# Patient Record
Sex: Female | Born: 1975 | Race: Black or African American | Hispanic: No | Marital: Married
Health system: Southern US, Community
[De-identification: ages and names within clinical notes are randomized; demographics above are authoritative.]

---

## 2018-04-06 ENCOUNTER — Other Ambulatory Visit: Payer: Self-pay | Admitting: Internal Medicine

## 2018-04-06 DIAGNOSIS — Z1231 Encounter for screening mammogram for malignant neoplasm of breast: Secondary | ICD-10-CM

## 2018-07-03 ENCOUNTER — Ambulatory Visit
Admission: RE | Admit: 2018-07-03 | Discharge: 2018-07-03 | Disposition: A | Discharge status: Home / Self Care | Payer: BLUE CROSS/BLUE SHIELD | Source: Ambulatory Visit | Attending: Internal Medicine | Admitting: Internal Medicine

## 2018-07-03 DIAGNOSIS — Z1231 Encounter for screening mammogram for malignant neoplasm of breast: Secondary | ICD-10-CM

## 2018-07-18 ENCOUNTER — Other Ambulatory Visit (HOSPITAL_COMMUNITY)
Admission: RE | Admit: 2018-07-18 | Discharge: 2018-07-18 | Disposition: A | Discharge status: Home / Self Care | Payer: BLUE CROSS/BLUE SHIELD | Source: Ambulatory Visit | Attending: Obstetrics and Gynecology | Admitting: Obstetrics and Gynecology

## 2018-07-18 ENCOUNTER — Other Ambulatory Visit: Payer: Self-pay | Admitting: Obstetrics and Gynecology

## 2018-07-18 DIAGNOSIS — Z01411 Encounter for gynecological examination (general) (routine) with abnormal findings: Secondary | ICD-10-CM | POA: Diagnosis present

## 2018-07-19 LAB — CYTOLOGY - PAP
Diagnosis: NEGATIVE
HPV (WINDOPATH): NOT DETECTED

## 2019-07-09 ENCOUNTER — Other Ambulatory Visit: Payer: Self-pay | Admitting: Obstetrics and Gynecology

## 2019-07-09 DIAGNOSIS — Z1231 Encounter for screening mammogram for malignant neoplasm of breast: Secondary | ICD-10-CM

## 2019-07-18 ENCOUNTER — Ambulatory Visit
Admission: RE | Admit: 2019-07-18 | Discharge: 2019-07-18 | Disposition: A | Discharge status: Home / Self Care | Payer: BLUE CROSS/BLUE SHIELD | Source: Ambulatory Visit | Attending: Obstetrics and Gynecology | Admitting: Obstetrics and Gynecology

## 2019-07-18 ENCOUNTER — Other Ambulatory Visit: Payer: Self-pay

## 2019-07-18 DIAGNOSIS — Z1231 Encounter for screening mammogram for malignant neoplasm of breast: Secondary | ICD-10-CM

## 2020-07-08 ENCOUNTER — Other Ambulatory Visit: Payer: Self-pay | Admitting: Obstetrics and Gynecology

## 2020-07-08 DIAGNOSIS — Z1231 Encounter for screening mammogram for malignant neoplasm of breast: Secondary | ICD-10-CM

## 2020-08-06 ENCOUNTER — Other Ambulatory Visit: Payer: Self-pay

## 2020-08-06 ENCOUNTER — Ambulatory Visit
Admission: RE | Admit: 2020-08-06 | Discharge: 2020-08-06 | Disposition: A | Discharge status: Home / Self Care | Payer: BC Managed Care – PPO | Source: Ambulatory Visit | Attending: Obstetrics and Gynecology | Admitting: Obstetrics and Gynecology

## 2020-08-06 DIAGNOSIS — Z1231 Encounter for screening mammogram for malignant neoplasm of breast: Secondary | ICD-10-CM

## 2021-01-22 ENCOUNTER — Ambulatory Visit (INDEPENDENT_AMBULATORY_CARE_PROVIDER_SITE_OTHER): Payer: BC Managed Care – PPO

## 2021-01-22 ENCOUNTER — Other Ambulatory Visit: Payer: Self-pay

## 2021-01-22 ENCOUNTER — Ambulatory Visit: Payer: BC Managed Care – PPO | Admitting: Podiatry

## 2021-01-22 DIAGNOSIS — M722 Plantar fascial fibromatosis: Secondary | ICD-10-CM

## 2021-01-22 DIAGNOSIS — M79671 Pain in right foot: Secondary | ICD-10-CM | POA: Diagnosis not present

## 2021-01-22 MED ORDER — TRIAMCINOLONE ACETONIDE 10 MG/ML IJ SUSP
10.0000 mg | Freq: Once | INTRAMUSCULAR | Status: AC
Start: 2021-01-22 — End: 2021-01-22
  Administered 2021-01-22: 10 mg

## 2021-01-22 MED ORDER — MELOXICAM 15 MG PO TABS: 15.0000 mg | ORAL_TABLET | Freq: Every day | ORAL | 0 refills | Status: AC

## 2021-01-22 NOTE — Progress Notes (Signed)
Subjective:   Patient ID: Leeroy Bock, female   DOB: 45 y.o.   MRN: [id number]   HPI 45 year old female presents the office today for concerns of right heel pain which is been ongoing for about 1 month.  She states it is tender to touch.  She feels that she is on her feet all day which aggravates her symptoms.  She works for [employer] and she works on Emergency planning/management officer toed shoes which aggravates her symptoms.  She tried over-the-counter Tylenol without any relief.  No recent injury or falls.  No numbness or tingling.  No other concerns.   Review of Systems  All other systems reviewed and are negative.  No past medical history on file.  No past surgical history on file.   Current Outpatient Medications:  .  meloxicam (MOBIC) 15 MG tablet, Take 1 tablet (15 mg total) by mouth daily., Disp: 30 tablet, Rfl: 0  Allergies  Allergen Reactions  . Cephalexin Swelling    Swelling of the tongue Tongue swelling   . Penicillins Swelling    Swelling of the tongue Tongue swelling         Objective:  Physical Exam  General: AAO x3, NAD  Dermatological: Skin is warm, dry and supple bilateral.  There are no open sores, no preulcerative lesions, no rash or signs of infection present.  Vascular: Dorsalis Pedis artery and Posterior Tibial artery pedal pulses are 2/4 bilateral with immedate capillary fill time. There is no pain with calf compression, swelling, warmth, erythema.   Neruologic: Grossly intact via light touch bilateral.  Negative Tinel sign.  Musculoskeletal: Tenderness to palpation along the plantar medial tubercle of the calcaneus at the insertion of plantar fascia on the right foot. There is no pain along the course of the plantar fascia within the arch of the foot. Plantar fascia appears to be intact. There is no pain with lateral compression of the calcaneus or pain with vibratory sensation. There is no pain along the course or insertion of the achilles tendon.  No other areas of tenderness to bilateral lower extremities. Muscular strength 5/5 in all groups tested bilateral.  Gait: Unassisted, Nonantalgic.       Assessment:   Right heel pain, plan fasciitis     Plan:  -Treatment options discussed including all alternatives, risks, and complications -Etiology of symptoms were discussed -X-rays were obtained and reviewed with the patient.  Inferior calcaneal spurring present.  No evidence of acute fracture. -Steroid injection performed.  See procedure note below -Plantar fascial brace dispensed -Prescribed mobic. Discussed side effects of the medication and directed to stop if any are to occur and call the office.  -Stretching, icing daily.  We discussed shoe modifications and orthotics.  She can start with an over-the-counter insert if needed we can make a custom insert.  Procedure: Injection Tendon/Ligament Discussed alternatives, risks, complications and verbal consent was obtained.  Location: Right plantar fascia at the glabrous junction; medial approach. Skin Prep: Alcohol. Injectate: 0.5cc 0.5% marcaine plain, 0.5 cc 2% lidocaine plain and, 1 cc kenalog 10. Disposition: Patient tolerated procedure well. Injection site dressed with a band-aid.  Post-injection care was discussed and return precautions discussed.   Return for platnar fasciits, heel pain in 4-6 weeks.  Vivi Barrack DPM

## 2021-01-22 NOTE — Patient Instructions (Signed)
For instructions on how to put on your Plantar Fascial Brace, please visit [email or web address]   Plantar Fasciitis (Heel Spur Syndrome) with Rehab The plantar fascia is a fibrous, ligament-like, soft-tissue structure that spans the bottom of the foot. Plantar fasciitis is a condition that causes pain in the foot due to inflammation of the tissue. SYMPTOMS   Pain and tenderness on the underneath side of the foot.  Pain that worsens with standing or walking. CAUSES  Plantar fasciitis is caused by irritation and injury to the plantar fascia on the underneath side of the foot. Common mechanisms of injury include:  Direct trauma to bottom of the foot.  Damage to a small nerve that runs under the foot where the main fascia attaches to the heel bone.  Stress placed on the plantar fascia due to bone spurs. RISK INCREASES WITH:   Activities that place stress on the plantar fascia (running, jumping, pivoting, or cutting).  Poor strength and flexibility.  Improperly fitted shoes.  Tight calf muscles.  Flat feet.  Failure to warm-up properly before activity.  Obesity. PREVENTION  Warm up and stretch properly before activity.  Allow for adequate recovery between workouts.  Maintain physical fitness:  Strength, flexibility, and endurance.  Cardiovascular fitness.  Maintain a health body weight.  Avoid stress on the plantar fascia.  Wear properly fitted shoes, including arch supports for individuals who have flat feet.  PROGNOSIS  If treated properly, then the symptoms of plantar fasciitis usually resolve without surgery. However, occasionally surgery is necessary.  RELATED COMPLICATIONS   Recurrent symptoms that may result in a chronic condition.  Problems of the lower back that are caused by compensating for the injury, such as limping.  Pain or weakness of the foot during push-off following surgery.  Chronic inflammation, scarring, and partial or complete  fascia tear, occurring more often from repeated injections.  TREATMENT  Treatment initially involves the use of ice and medication to help reduce pain and inflammation. The use of strengthening and stretching exercises may help reduce pain with activity, especially stretches of the Achilles tendon. These exercises may be performed at home or with a therapist. Your caregiver may recommend that you use heel cups of arch supports to help reduce stress on the plantar fascia. Occasionally, corticosteroid injections are given to reduce inflammation. If symptoms persist for greater than 6 months despite non-surgical (conservative), then surgery may be recommended.   MEDICATION   If pain medication is necessary, then nonsteroidal anti-inflammatory medications, such as aspirin and ibuprofen, or other minor pain relievers, such as acetaminophen, are often recommended.  Do not take pain medication within 7 days before surgery.  Prescription pain relievers may be given if deemed necessary by your caregiver. Use only as directed and only as much as you need.  Corticosteroid injections may be given by your caregiver. These injections should be reserved for the most serious cases, because they may only be given a certain number of times.  HEAT AND COLD  Cold treatment (icing) relieves pain and reduces inflammation. Cold treatment should be applied for 10 to 15 minutes every 2 to 3 hours for inflammation and pain and immediately after any activity that aggravates your symptoms. Use ice packs or massage the area with a piece of ice (ice massage).  Heat treatment may be used prior to performing the stretching and strengthening activities prescribed by your caregiver, physical therapist, or athletic trainer. Use a heat pack or soak the injury in warm water.  SEEK IMMEDIATE MEDICAL   CARE IF:  Treatment seems to offer no benefit, or the condition worsens.  Any medications produce adverse side effects.   EXERCISES- RANGE OF MOTION (ROM) AND STRETCHING EXERCISES - Plantar Fasciitis (Heel Spur Syndrome) These exercises may help you when beginning to rehabilitate your injury. Your symptoms may resolve with or without further involvement from your physician, physical therapist or athletic trainer. While completing these exercises, remember:   Restoring tissue flexibility helps normal motion to return to the joints. This allows healthier, less painful movement and activity.  An effective stretch should be held for at least 30 seconds.  A stretch should never be painful. You should only feel a gentle lengthening or release in the stretched tissue.  RANGE OF MOTION - Toe Extension, Flexion  Sit with your right / left leg crossed over your opposite knee.  Grasp your toes and gently pull them back toward the top of your foot. You should feel a stretch on the bottom of your toes and/or foot.  Hold this stretch for 10 seconds.  Now, gently pull your toes toward the bottom of your foot. You should feel a stretch on the top of your toes and or foot.  Hold this stretch for 10 seconds. Repeat  times. Complete this stretch 3 times per day.   RANGE OF MOTION - Ankle Dorsiflexion, Active Assisted  Remove shoes and sit on a chair that is preferably not on a carpeted surface.  Place right / left foot under knee. Extend your opposite leg for support.  Keeping your heel down, slide your right / left foot back toward the chair until you feel a stretch at your ankle or calf. If you do not feel a stretch, slide your bottom forward to the edge of the chair, while still keeping your heel down.  Hold this stretch for 10 seconds. Repeat 3 times. Complete this stretch 2 times per day.   STRETCH  Gastroc, Standing  Place hands on wall.  Extend right / left leg, keeping the front knee somewhat bent.  Slightly point your toes inward on your back foot.  Keeping your right / left heel on the floor and your  knee straight, shift your weight toward the wall, not allowing your back to arch.  You should feel a gentle stretch in the right / left calf. Hold this position for 10 seconds. Repeat 3 times. Complete this stretch 2 times per day.  STRETCH  Soleus, Standing  Place hands on wall.  Extend right / left leg, keeping the other knee somewhat bent.  Slightly point your toes inward on your back foot.  Keep your right / left heel on the floor, bend your back knee, and slightly shift your weight over the back leg so that you feel a gentle stretch deep in your back calf.  Hold this position for 10 seconds. Repeat 3 times. Complete this stretch 2 times per day.  STRETCH  Gastrocsoleus, Standing  Note: This exercise can place a lot of stress on your foot and ankle. Please complete this exercise only if specifically instructed by your caregiver.   Place the ball of your right / left foot on a step, keeping your other foot firmly on the same step.  Hold on to the wall or a rail for balance.  Slowly lift your other foot, allowing your body weight to press your heel down over the edge of the step.  You should feel a stretch in your right / left calf.  Hold this   position for 10 seconds.  Repeat this exercise with a slight bend in your right / left knee. Repeat 3 times. Complete this stretch 2 times per day.   STRENGTHENING EXERCISES - Plantar Fasciitis (Heel Spur Syndrome)  These exercises may help you when beginning to rehabilitate your injury. They may resolve your symptoms with or without further involvement from your physician, physical therapist or athletic trainer. While completing these exercises, remember:   Muscles can gain both the endurance and the strength needed for everyday activities through controlled exercises.  Complete these exercises as instructed by your physician, physical therapist or athletic trainer. Progress the resistance and repetitions only as guided.  STRENGTH -  Towel Curls  Sit in a chair positioned on a non-carpeted surface.  Place your foot on a towel, keeping your heel on the floor.  Pull the towel toward your heel by only curling your toes. Keep your heel on the floor. Repeat 3 times. Complete this exercise 2 times per day.  STRENGTH - Ankle Inversion  Secure one end of a rubber exercise band/tubing to a fixed object (table, pole). Loop the other end around your foot just before your toes.  Place your fists between your knees. This will focus your strengthening at your ankle.  Slowly, pull your big toe up and in, making sure the band/tubing is positioned to resist the entire motion.  Hold this position for 10 seconds.  Have your muscles resist the band/tubing as it slowly pulls your foot back to the starting position. Repeat 3 times. Complete this exercises 2 times per day.  Document Released: 09/13/2005 Document Revised: 12/06/2011 Document Reviewed: 12/26/2008 [employer] Patient Information 2014 [employer]. 

## 2021-02-19 ENCOUNTER — Ambulatory Visit: Payer: BC Managed Care – PPO | Admitting: Podiatry

## 2021-03-09 ENCOUNTER — Ambulatory Visit: Payer: BC Managed Care – PPO | Admitting: Podiatry

## 2021-07-10 ENCOUNTER — Other Ambulatory Visit: Payer: Self-pay | Admitting: Obstetrics and Gynecology

## 2021-07-10 DIAGNOSIS — Z1231 Encounter for screening mammogram for malignant neoplasm of breast: Secondary | ICD-10-CM

## 2021-08-07 ENCOUNTER — Ambulatory Visit
Admission: RE | Admit: 2021-08-07 | Discharge: 2021-08-07 | Disposition: A | Discharge status: Home / Self Care | Payer: BC Managed Care – PPO | Source: Ambulatory Visit | Attending: Obstetrics and Gynecology | Admitting: Obstetrics and Gynecology

## 2021-08-07 ENCOUNTER — Other Ambulatory Visit: Payer: Self-pay

## 2021-08-07 DIAGNOSIS — Z1231 Encounter for screening mammogram for malignant neoplasm of breast: Secondary | ICD-10-CM

## 2021-09-14 ENCOUNTER — Other Ambulatory Visit: Payer: Self-pay | Admitting: Internal Medicine

## 2021-09-14 DIAGNOSIS — R110 Nausea: Secondary | ICD-10-CM

## 2021-09-22 ENCOUNTER — Ambulatory Visit
Admission: RE | Admit: 2021-09-22 | Discharge: 2021-09-22 | Disposition: A | Discharge status: Home / Self Care | Payer: BC Managed Care – PPO | Source: Ambulatory Visit | Attending: Internal Medicine | Admitting: Internal Medicine

## 2021-09-22 DIAGNOSIS — R110 Nausea: Secondary | ICD-10-CM

## 2022-07-28 ENCOUNTER — Other Ambulatory Visit: Payer: Self-pay | Admitting: Obstetrics and Gynecology

## 2022-07-28 DIAGNOSIS — Z1231 Encounter for screening mammogram for malignant neoplasm of breast: Secondary | ICD-10-CM

## 2022-08-10 ENCOUNTER — Ambulatory Visit
Admission: RE | Admit: 2022-08-10 | Discharge: 2022-08-10 | Disposition: A | Discharge status: Home / Self Care | Payer: BC Managed Care – PPO | Source: Ambulatory Visit | Attending: Obstetrics and Gynecology | Admitting: Obstetrics and Gynecology

## 2022-08-10 DIAGNOSIS — Z1231 Encounter for screening mammogram for malignant neoplasm of breast: Secondary | ICD-10-CM

## 2023-06-11 IMAGING — MG MM DIGITAL SCREENING BILAT W/ TOMO AND CAD
8 series · 8 of 24 positions shown · non-contrast
Comparison: Previous exam(s).

CLINICAL DATA: Screening.

EXAM:
DIGITAL SCREENING BILATERAL MAMMOGRAM WITH TOMOSYNTHESIS AND CAD
TECHNIQUE: Bilateral screening digital craniocaudal and mediolateral oblique
mammograms were obtained. Bilateral screening digital breast
tomosynthesis was performed. The images were evaluated with
computer-aided detection.

[L CC synth-2D]
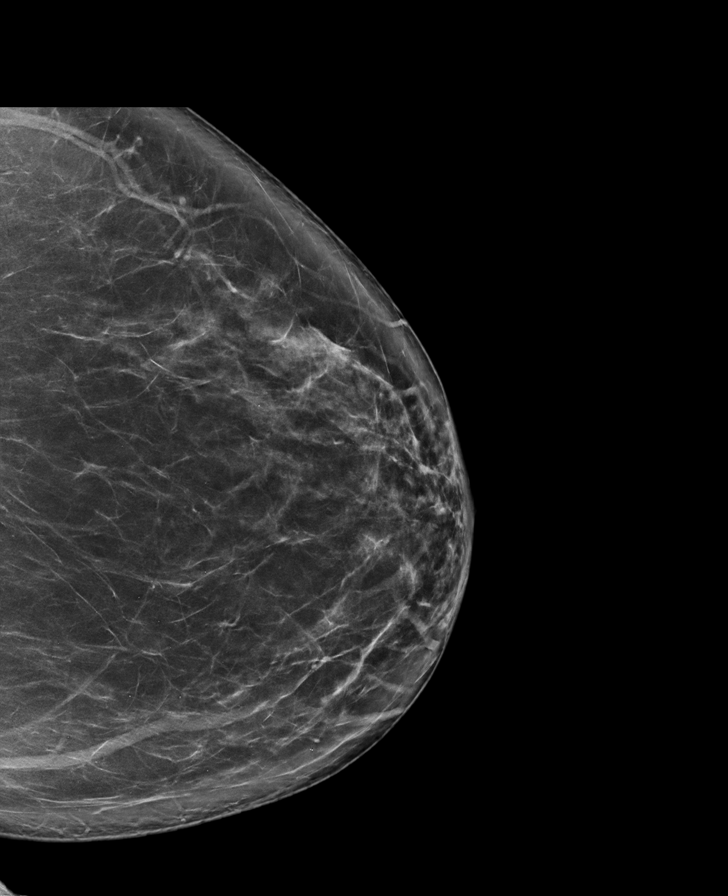

[R MLO synth-2D]
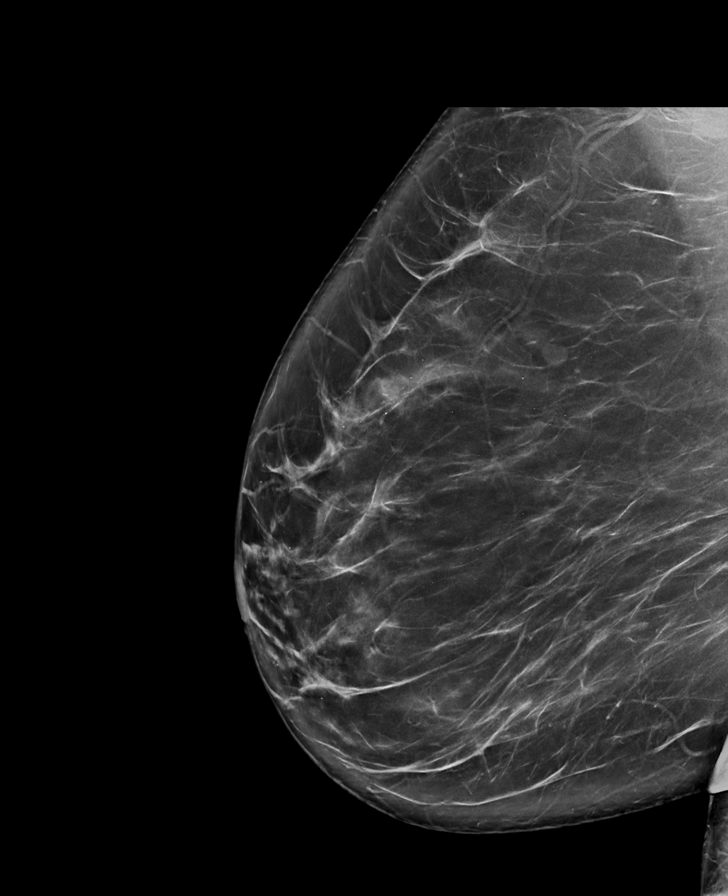

[R CC synth-2D]
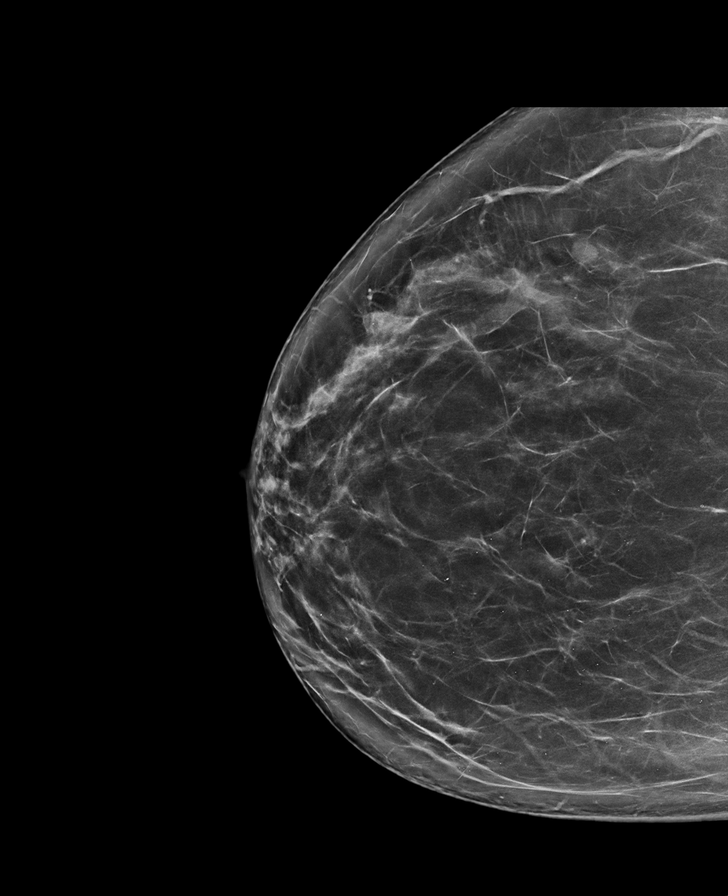

[L MLO synth-2D]
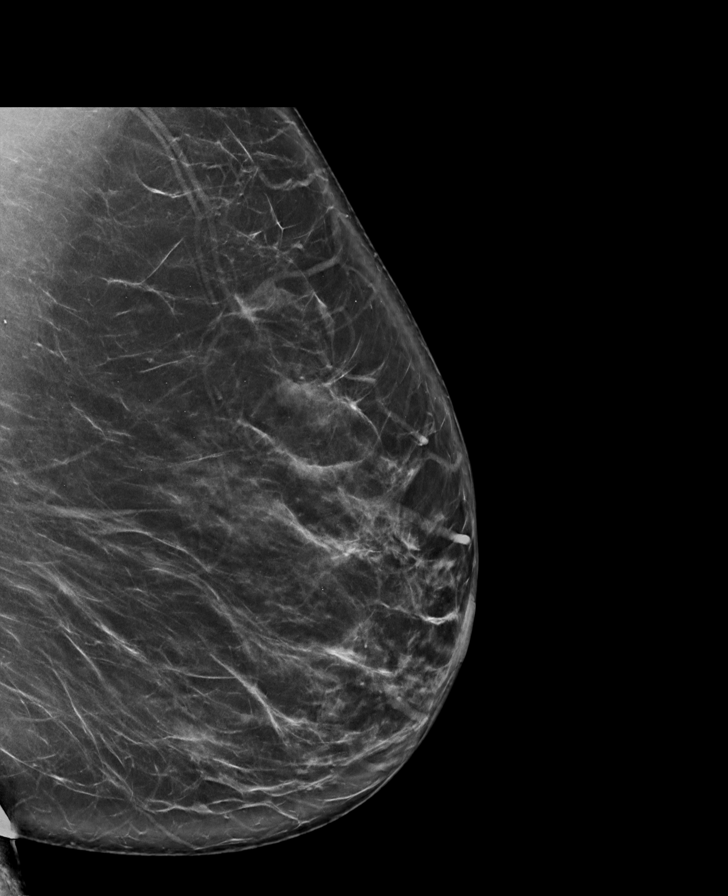

[R CC tomo · tomo slice 47/92.0]
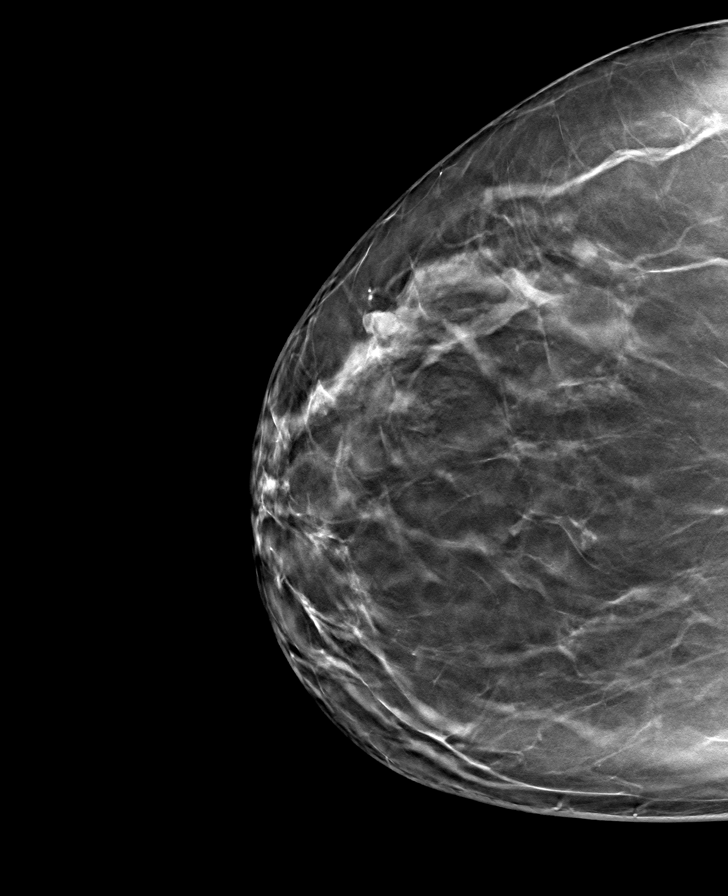

[R MLO tomo · tomo slice 51/101.0]
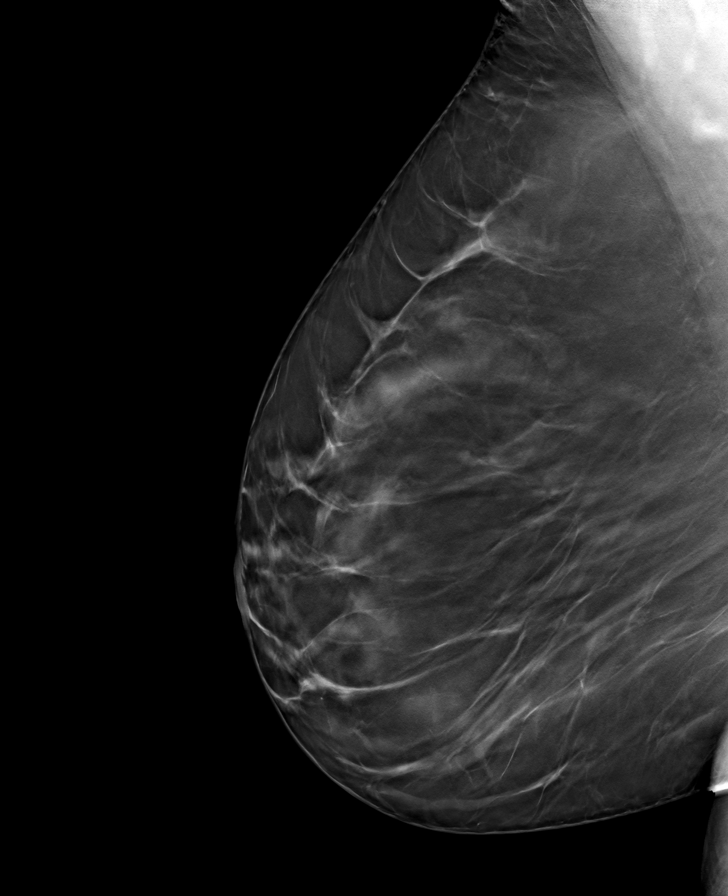

[L MLO tomo · tomo slice 49/98.0]
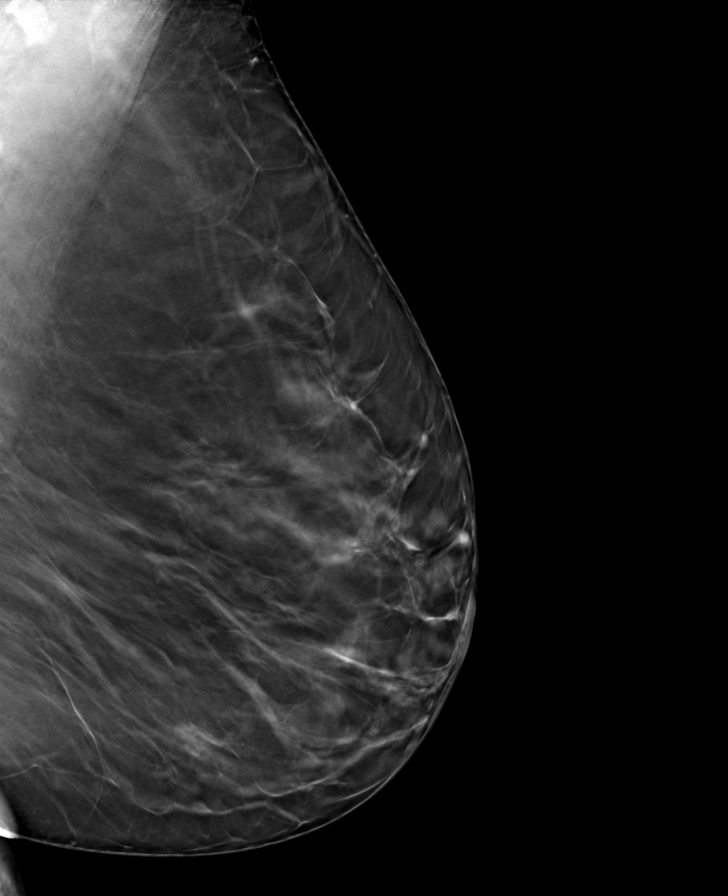

[L CC tomo · tomo slice 49/96.0]
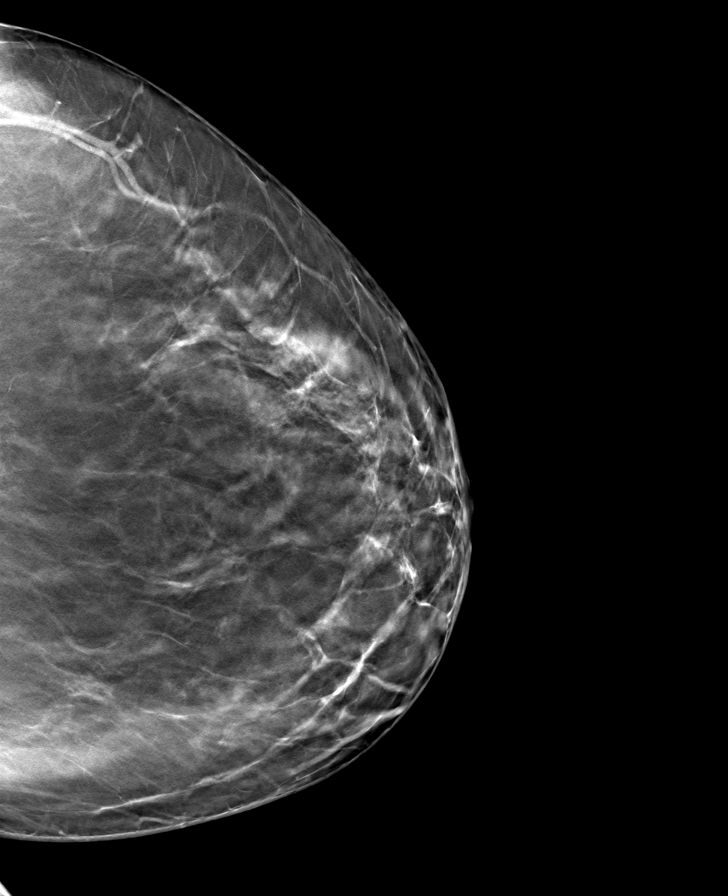

[8 of 24 positions shown; findings below may reference images not displayed]

ACR Breast Density Category b: There are scattered areas of
fibroglandular density.
FINDINGS: There are no findings suspicious for malignancy.
IMPRESSION: No mammographic evidence of malignancy. A result letter of this
screening mammogram will be mailed directly to the patient.

RECOMMENDATION:
Screening mammogram in one year. (Code:51-O-LD2)

BI-RADS CATEGORY  1: Negative.

## 2023-07-07 ENCOUNTER — Other Ambulatory Visit: Payer: Self-pay | Admitting: Obstetrics and Gynecology

## 2023-07-07 DIAGNOSIS — Z1231 Encounter for screening mammogram for malignant neoplasm of breast: Secondary | ICD-10-CM

## 2023-07-27 IMAGING — US US ABDOMEN COMPLETE
1 series · 14 of 25 positions shown · non-contrast
Comparison: None.

CLINICAL DATA: Nausea

EXAM:
ABDOMEN ULTRASOUND COMPLETE

[Series 1: us abdomen complete · 0.23mm/px · 14 of 68 slices shown]
[im 1/68]
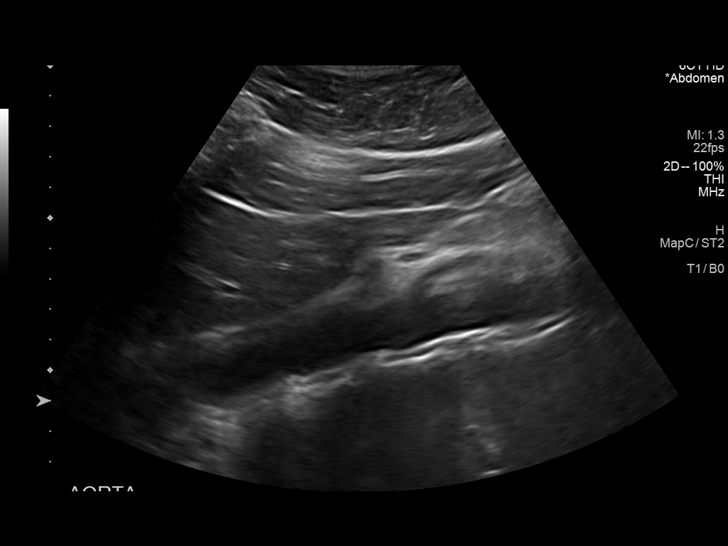
[im 6/68]
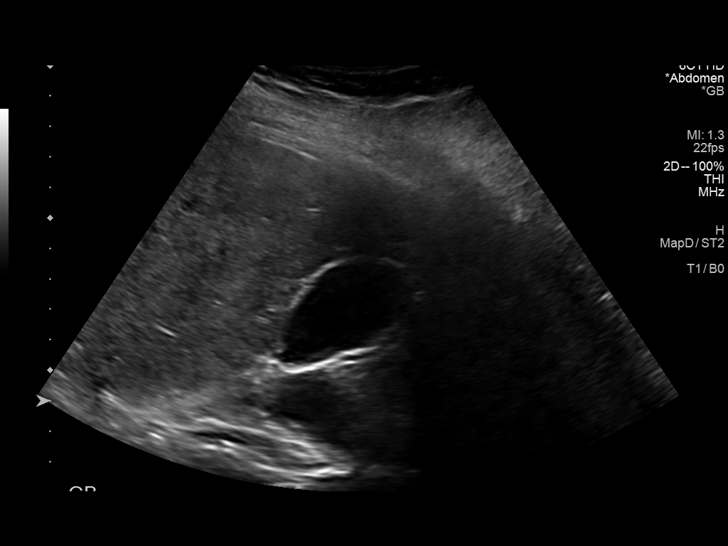
[im 12/68]
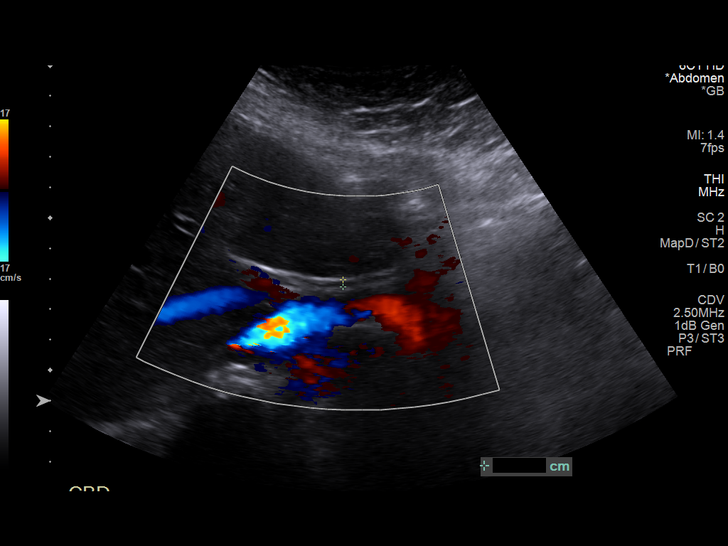
[im 17/68]
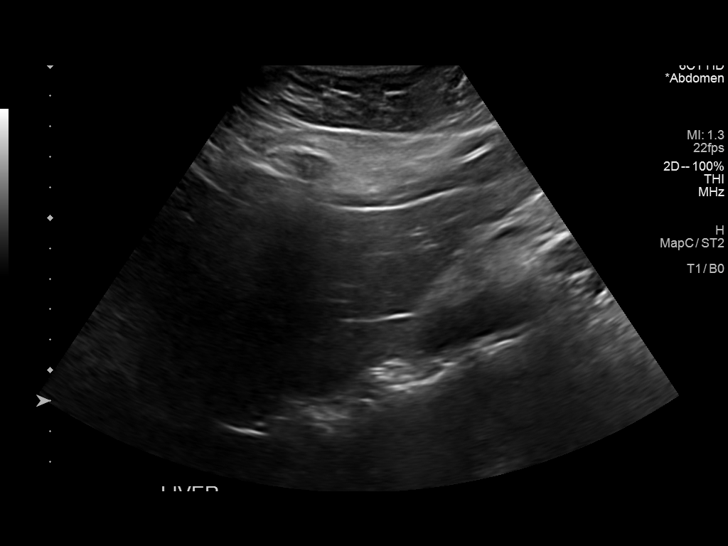
[im 23/68]
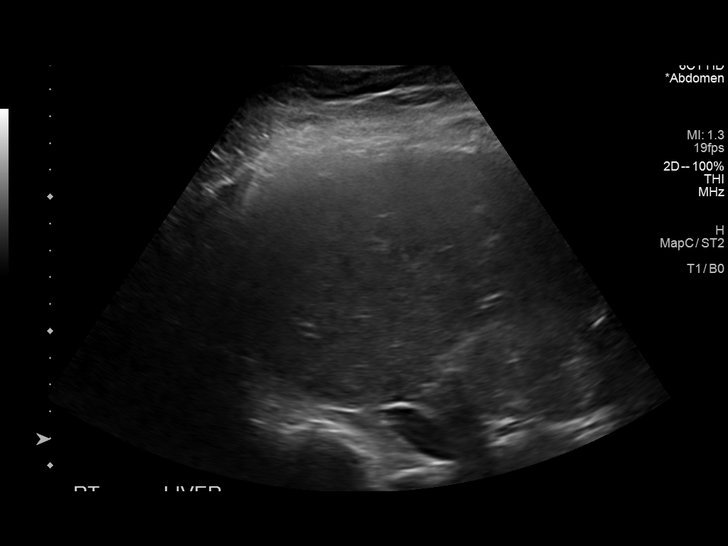
[im 26/68]
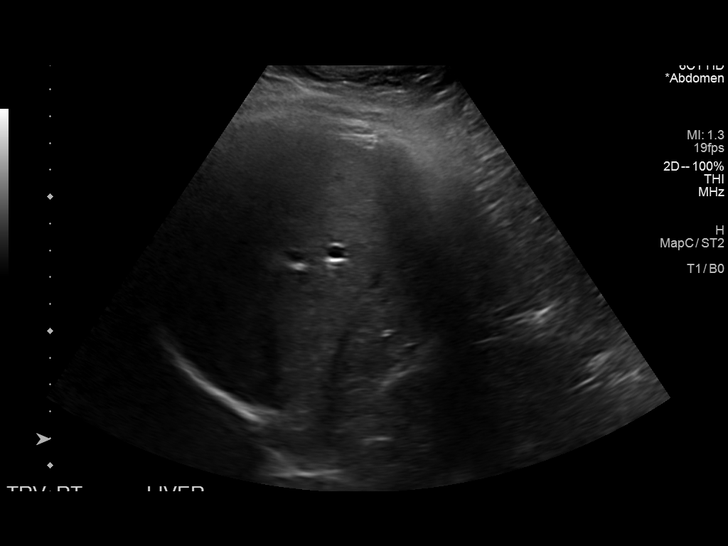
[im 31/68]
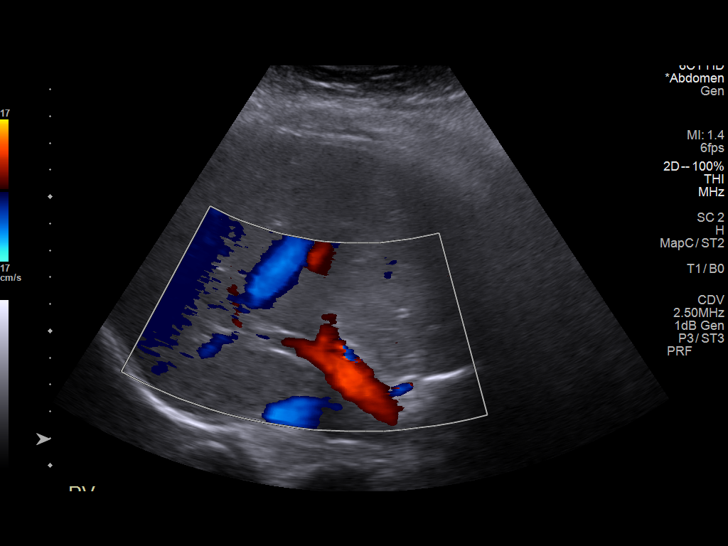
[im 37/68]
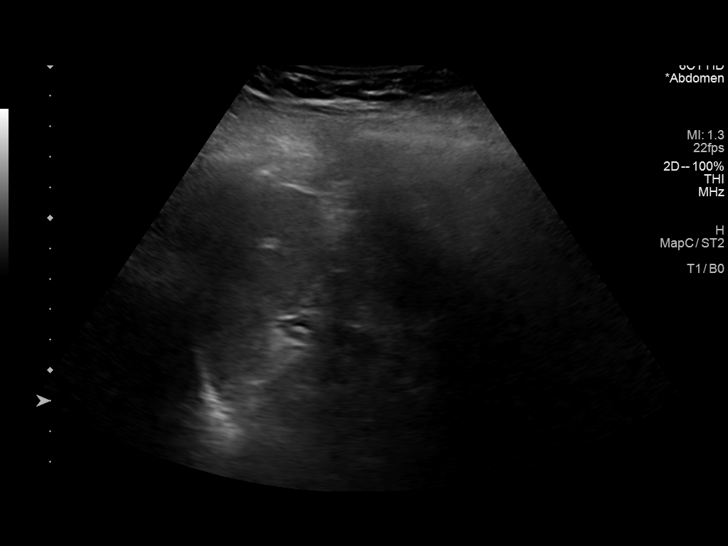
[im 42/68]
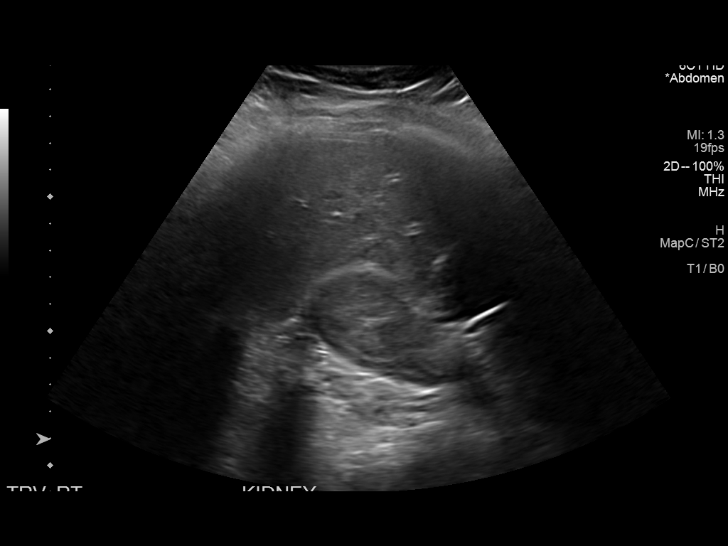
[im 45/68]
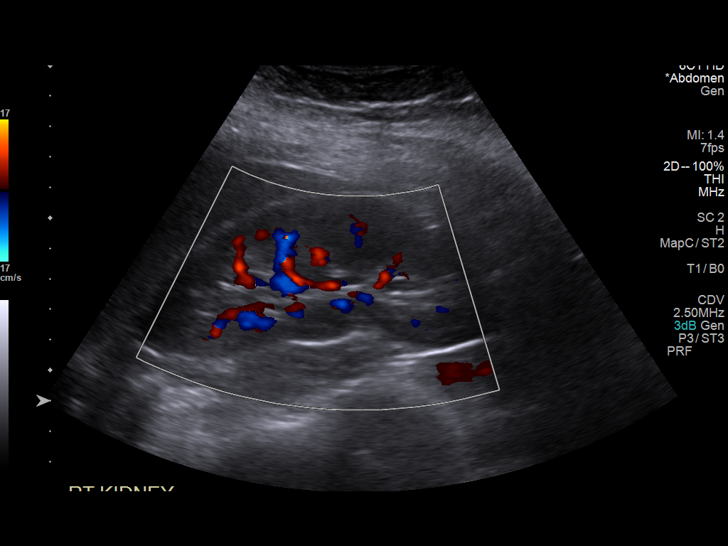
[im 51/68]
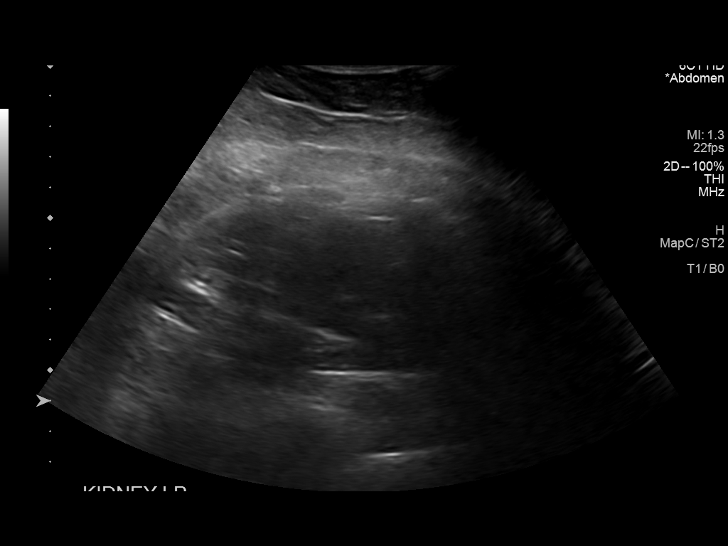
[im 56/68]
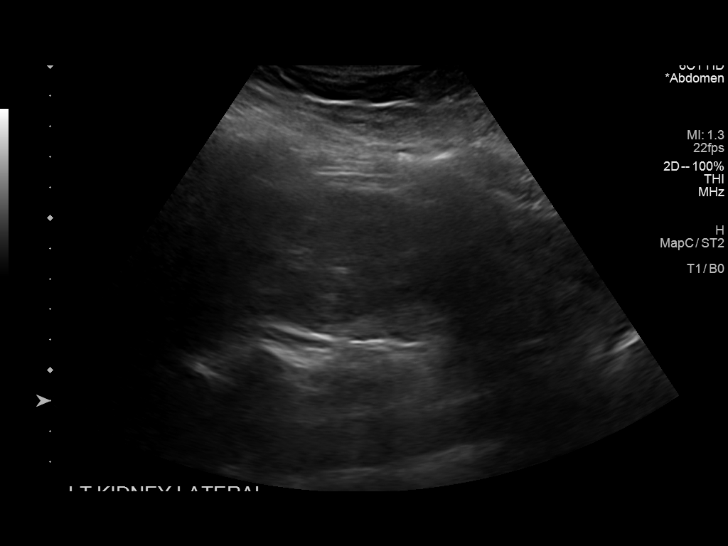
[im 62/68]
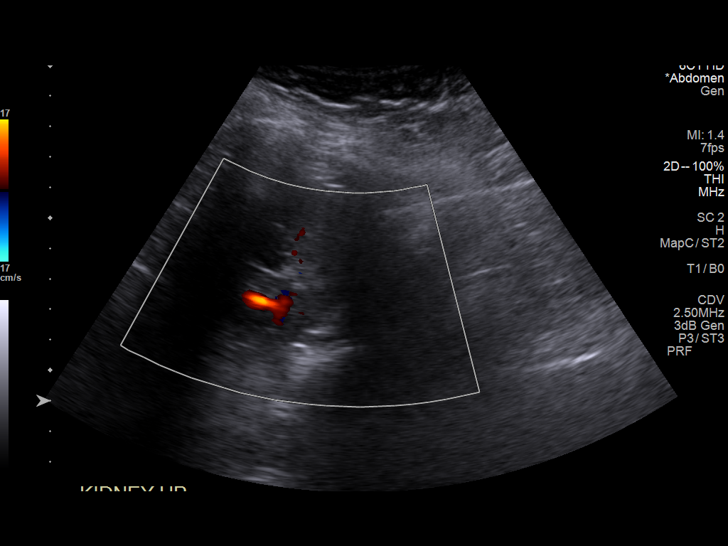
[im 68/68]
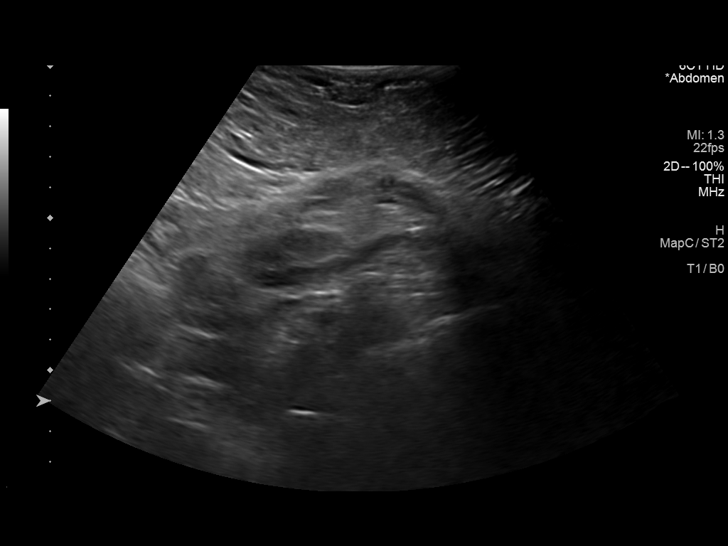

[14 of 25 positions shown; findings below may reference images not displayed]

FINDINGS: Gallbladder: No gallstones or wall thickening visualized. No
sonographic Murphy sign noted by sonographer.

Common bile duct: Diameter: 2.4 mm

Liver: No focal lesion identified. Within normal limits in
parenchymal echogenicity. Portal vein is patent on color Doppler
imaging with normal direction of blood flow towards the liver.

IVC: No abnormality visualized.

Pancreas: Visualized portion unremarkable.

Spleen: Size and appearance within normal limits.

Right Kidney: Length: 12 cm. Cortical echogenicity is unremarkable.
There is slight prominence of renal pelvis without significant
dilation of minor calices.

Left Kidney: Length: 11.6 cm. Echogenicity within normal limits. No
mass or hydronephrosis visualized.

Abdominal aorta: No aneurysm visualized.

Other findings: None.
IMPRESSION: No significant sonographic abnormality is seen in the abdomen.

## 2023-08-12 ENCOUNTER — Ambulatory Visit
Admission: RE | Admit: 2023-08-12 | Discharge: 2023-08-12 | Disposition: A | Discharge status: Home / Self Care | Payer: BC Managed Care – PPO | Source: Ambulatory Visit | Attending: Obstetrics and Gynecology | Admitting: Obstetrics and Gynecology

## 2023-08-12 DIAGNOSIS — Z1231 Encounter for screening mammogram for malignant neoplasm of breast: Secondary | ICD-10-CM

## 2024-07-16 ENCOUNTER — Other Ambulatory Visit: Payer: Self-pay | Admitting: Internal Medicine

## 2024-07-16 DIAGNOSIS — Z1231 Encounter for screening mammogram for malignant neoplasm of breast: Secondary | ICD-10-CM

## 2024-07-27 ENCOUNTER — Encounter: Payer: Self-pay | Admitting: Physician Assistant

## 2024-08-14 ENCOUNTER — Ambulatory Visit
Admission: RE | Admit: 2024-08-14 | Discharge: 2024-08-14 | Disposition: A | Source: Ambulatory Visit | Attending: Internal Medicine | Admitting: Internal Medicine

## 2024-08-14 DIAGNOSIS — Z1231 Encounter for screening mammogram for malignant neoplasm of breast: Secondary | ICD-10-CM

## 2024-08-15 ENCOUNTER — Other Ambulatory Visit (HOSPITAL_COMMUNITY)
Admission: RE | Admit: 2024-08-15 | Discharge: 2024-08-15 | Disposition: A | Source: Ambulatory Visit | Attending: Obstetrics and Gynecology | Admitting: Obstetrics and Gynecology

## 2024-08-15 DIAGNOSIS — Z01419 Encounter for gynecological examination (general) (routine) without abnormal findings: Secondary | ICD-10-CM | POA: Insufficient documentation

## 2024-08-16 ENCOUNTER — Other Ambulatory Visit: Payer: Self-pay | Admitting: Internal Medicine

## 2024-08-16 DIAGNOSIS — R928 Other abnormal and inconclusive findings on diagnostic imaging of breast: Secondary | ICD-10-CM

## 2024-08-20 LAB — CYTOLOGY - PAP
Adequacy: ABSENT
Comment: NEGATIVE
Diagnosis: NEGATIVE
High risk HPV: NEGATIVE

## 2024-08-28 ENCOUNTER — Ambulatory Visit
Admission: RE | Admit: 2024-08-28 | Discharge: 2024-08-28 | Disposition: A | Source: Ambulatory Visit | Attending: Internal Medicine | Admitting: Internal Medicine

## 2024-08-28 DIAGNOSIS — R928 Other abnormal and inconclusive findings on diagnostic imaging of breast: Secondary | ICD-10-CM

## 2024-09-12 DIAGNOSIS — G4700 Insomnia, unspecified: Secondary | ICD-10-CM | POA: Insufficient documentation

## 2024-09-12 DIAGNOSIS — R110 Nausea: Secondary | ICD-10-CM | POA: Insufficient documentation

## 2024-09-12 DIAGNOSIS — I071 Rheumatic tricuspid insufficiency: Secondary | ICD-10-CM | POA: Insufficient documentation

## 2024-09-12 DIAGNOSIS — E7800 Pure hypercholesterolemia, unspecified: Secondary | ICD-10-CM | POA: Insufficient documentation

## 2024-09-12 DIAGNOSIS — D573 Sickle-cell trait: Secondary | ICD-10-CM | POA: Insufficient documentation

## 2024-09-12 DIAGNOSIS — R0602 Shortness of breath: Secondary | ICD-10-CM | POA: Insufficient documentation

## 2024-09-12 DIAGNOSIS — E66811 Obesity, class 1: Secondary | ICD-10-CM | POA: Insufficient documentation

## 2024-09-12 DIAGNOSIS — K219 Gastro-esophageal reflux disease without esophagitis: Secondary | ICD-10-CM | POA: Insufficient documentation

## 2024-09-12 DIAGNOSIS — R232 Flushing: Secondary | ICD-10-CM | POA: Insufficient documentation

## 2024-09-12 NOTE — Progress Notes (Unsigned)
 Chief Complaint: GERD, Nausea and epigastric pain  HPI:    Brittney Kelly is a 48 year old female with a past medical history as listed below, who was referred to me by Toy Laurance POUR, MD for a complaint of GERD, nausea and epigastric pain.      02/20/2021 abdominal ultrasound for nausea was normal.    05/09/2024 CBC normal.    08/06/2024 patient seen by [facility] cardiology for heart murmur and chest tightness.  At that time they ordered a stress test and echo.    Today, patient presents to clinic and tells me that for at least the past 11 years she has had reflux issues.  For a long time she used  over-the-counter products and then eventually got prescribed Pantoprazole 40 mg once a day which worked well for her for a few years but over the past year or so she has noticed an increase in symptoms.  She was told to stop the Pantoprazole because it could cause issues with calcium absorption, so she has not been taking that anymore.  Right now she just takes Famotidine 40 mg in the evening but tells me she works third shift so this is hard for her to remember.  Describes that 2 out of 7 days of the week she will feel indigestion with burping and some epigastric discomfort as well as nausea which occasionally makes her feel like she needs to vomit.  In those episodes she will take some Tums which does not really help right away but it will get better with time if she does stay standing.  She does recall having a prior EGD but cannot remember when or where.    Describes a colonoscopy in 2019 at [facility].    Denies fever, chills, weight loss, blood in her stool or symptoms that awaken her from sleep.   Past Medical History:  Diagnosis Date   Anxiety disorder    Class 1 obesity    Fatigue    GERD (gastroesophageal reflux disease)    Hot flash not due to menopause    Hypercholesteremia    Insomnia    Polyarthropathy    Sickle cell trait    Tricuspid regurgitation     Past Surgical History:   Procedure Laterality Date   COLONOSCOPY  09/18/2000   Dr. Rosalie - normal   TUBAL LIGATION  2005    Current Outpatient Medications  Medication Sig Dispense Refill   ascorbic acid (VITAMIN C) 1000 MG tablet Take 1,000 mg by mouth daily.     famotidine (PEPCID) 40 MG tablet Take 40 mg by mouth at bedtime.     fluticasone (FLONASE) 50 MCG/ACT nasal spray Place 1 spray into both nostrils daily as needed for allergies.     GARLIC PO Take 1,000 mg by mouth daily.     Glucosamine-Chondroitin (COSAMIN DS) 500-400 MG CAPS 0 Refill(s)     Multiple Vitamin (MULTI-VITAMIN) tablet Take 1 tablet by mouth daily.     pantoprazole (PROTONIX) 40 MG tablet Take 40 mg by mouth.     No current facility-administered medications for this visit.    Allergies as of 09/13/2024 - Review Complete 09/13/2024  Allergen Reaction Noted   Cephalexin Swelling 05/14/2014   Penicillins Swelling 05/14/2014    Family History  Problem Relation Age of Onset   Breast cancer Neg Hx     Social History   Socioeconomic History   Marital status: Married    Spouse name: Not on file   Number  of children: Not on file   Years of education: Not on file   Highest education level: Not on file  Occupational History   Not on file  Tobacco Use   Smoking status: Not on file   Smokeless tobacco: Not on file  Substance and Sexual Activity   Alcohol use: Not on file   Drug use: Not on file   Sexual activity: Not on file  Other Topics Concern   Not on file  Social History Narrative   Not on file   Social Drivers of Health   Tobacco Use: Low Risk  (08/06/2024)   Received from [facility]   Patient History    Smoking Tobacco Use: Never    Smokeless Tobacco Use: Never    Passive Exposure: Not on file  Financial Resource Strain: Not on file  Food Insecurity: Low Risk (04/28/2023)   Received from [facility]   Epic    Within the past 12 months, you worried that your food would run out before you got  money to buy more: Never true    Within the past 12 months, the food you bought just didn't last and you didn't have money to get more. : Never true  Transportation Needs: Not on file (04/28/2023)  Physical Activity: Not on file  Stress: Not on file  Social Connections: Not on file  Intimate Partner Violence: Not on file  Depression (EYV7-0): Not on file  Alcohol Screen: Not on file  Housing: Unknown (08/04/2024)   Received from [facility]   Epic    Unable to Pay for Housing in the Last Year: Not on file    Number of Times Moved in the Last Year: Not on file    At any time in the past 12 months, were you homeless or living in a shelter (including now)?: No  Utilities: Low Risk (04/28/2023)   Received from [facility]   Utilities    In the past 12 months has the electric, gas, oil, or water company threatened to shut off services in your home? : No  Health Literacy: Not on file    Review of Systems:    Constitutional: No weight loss, fever or chillscongestion Skin: No rash or itching Cardiovascular: +atypical chest pain Respiratory: No SOB  Gastrointestinal: See HPI and otherwise negative Genitourinary: No dysuria Neurological: No headache, dizziness or syncope Musculoskeletal: No new muscle or joint pain Hematologic: No bleeding Psychiatric: No history of depression or anxiety   Physical Exam:  Vital signs: BP 118/72 (BP Location: Right Arm, Patient Position: Sitting, Cuff Size: Normal)   Pulse 90   Ht 5' 7 (1.702 m) Comment: per patient  Wt 199 lb 6 oz (90.4 kg)   BMI 31.23 kg/m    Constitutional:   Pleasant AA female appears to be in NAD, Well developed, Well nourished, alert and cooperative Head:  Normocephalic and atraumatic. Eyes:   PEERL, EOMI. No icterus. Conjunctiva pink. Ears:  Normal auditory acuity. Neck:  Supple Throat: Oral cavity and pharynx without inflammation, swelling or lesion.  Respiratory: Respirations even and unlabored. Lungs  clear to auscultation bilaterally.   No wheezes, crackles, or rhonchi.  Cardiovascular: Normal S1, S2. No MRG. Regular rate and rhythm. No peripheral edema, cyanosis or pallor.  Gastrointestinal:  Soft, nondistended, nontender. No rebound or guarding. Normal bowel sounds. No appreciable masses or hepatomegaly. Rectal:  Not performed.  Msk:  Symmetrical without gross deformities. Without edema, no deformity or joint abnormality.  Neurologic:  Alert and  oriented x4;  grossly normal neurologically.  Skin:   Dry and intact without significant lesions or rashes. Psychiatric: Demonstrates good judgement and reason without abnormal affect or behaviors.  See HPI for recent labs.  Assessment: 1.  GERD: With some dyspepsia and epigastric discomfort as well as atypical chest pain for the past 11 years, worsened recently, stopped Pantoprazole 40 mg daily which she had been on for years as her PCP told her this may decrease calcium absorption, currently on Famotidine 40 mg at night when she remembers to take it, most concerning to her as a feeling of nausea she gets occasionally with these episodes, somewhat helped by Tums, describes a previous EGD but cannot remember when; most likely chronic gastritis plus GERD +/- H. pylori 2.  Screening for colon cancer: Describes a colonoscopy in 2019 is normal, we will request records today 3.  Atypical chest pain and murmur: Currently being worked up by cardiology with stress test and echo pending  Plan: 1.  At this point discontinue Famotidine. 2.  Start Nexium  40 mg twice daily, 30-60 minutes before her first meal of the day and last meal of the day.  She works third shift so she will have to figure out when this is.  Stop Pantoprazole for now.  Prescribe #60 with 5 refills 3.  Reviewed antireflux diet and lifestyle modifications.  Discussed the pathophysiology of reflux. 4.  We will request records from patient's/GI doctor, I think it is [facility], colonoscopy and  EGD. 5.  Patient to follow in clinic with me in 2 to 3 months.  If she has never had an EGD would recommend 1 then, hopefully after cardiac workup has completed.  If she still has symptoms would also recommend repeat EGD. 6.  Patient was assigned to Dr. Wilhelmenia today.  Brittney Failing, PA-C [facility] 09/13/2024, 9:50 AM  Cc: Toy Laurance POUR, MD

## 2024-09-13 ENCOUNTER — Other Ambulatory Visit: Payer: Self-pay | Admitting: Physician Assistant

## 2024-09-13 ENCOUNTER — Encounter: Payer: Self-pay | Admitting: Physician Assistant

## 2024-09-13 ENCOUNTER — Ambulatory Visit: Admitting: Physician Assistant

## 2024-09-13 VITALS — BP 118/72 | HR 90 | Ht 67.0 in | Wt 199.4 lb

## 2024-09-13 DIAGNOSIS — R1013 Epigastric pain: Secondary | ICD-10-CM

## 2024-09-13 DIAGNOSIS — Z1211 Encounter for screening for malignant neoplasm of colon: Secondary | ICD-10-CM

## 2024-09-13 DIAGNOSIS — R110 Nausea: Secondary | ICD-10-CM

## 2024-09-13 DIAGNOSIS — K219 Gastro-esophageal reflux disease without esophagitis: Secondary | ICD-10-CM

## 2024-09-13 DIAGNOSIS — R0789 Other chest pain: Secondary | ICD-10-CM

## 2024-09-13 DIAGNOSIS — R011 Cardiac murmur, unspecified: Secondary | ICD-10-CM | POA: Diagnosis not present

## 2024-09-13 MED ORDER — ESOMEPRAZOLE MAGNESIUM 40 MG PO CPDR: 40.0000 mg | DELAYED_RELEASE_CAPSULE | Freq: Two times a day (BID) | ORAL | 5 refills | Status: AC

## 2024-09-13 NOTE — Patient Instructions (Signed)
 We have sent the following medications to your pharmacy for you to pick up at your convenience: Esomeprazole    Follow-up in 2-3 months. Office will contact you to schedule at a later time.   _______________________________________________________  If your blood pressure at your visit was 140/90 or greater, please contact your primary care physician to follow up on this.  _______________________________________________________  If you are age 50 or older, your body mass index should be between 23-30. Your Body mass index is 31.23 kg/m. If this is out of the aforementioned range listed, please consider follow up with your Primary Care Provider.  If you are age 52 or younger, your body mass index should be between 19-25. Your Body mass index is 31.23 kg/m. If this is out of the aformentioned range listed, please consider follow up with your Primary Care Provider.   ________________________________________________________  The [facility] providers would like to encourage you to use MYCHART to communicate with providers for non-urgent requests or questions.  Due to long hold times on the telephone, sending your provider a message by [facility] may be a faster and more efficient way to get a response.  Please allow 48 business hours for a response.  Please remember that this is for non-urgent requests.  _______________________________________________________  [facility] is using a team-based approach to care.  Your team is made up of your doctor and two to three APPS. Our APPS (Nurse Practitioners and Physician Assistants) work with your physician to ensure care continuity for you. They are fully qualified to address your health concerns and develop a treatment plan. They communicate directly with your gastroenterologist to care for you. Seeing the Advanced Practice Practitioners on your physician's team can help you by facilitating care more promptly, often allowing for earlier  appointments, access to diagnostic testing, procedures, and other specialty referrals.   Thank you for choosing me and [facility].  Delon Failing, PA-C

## 2024-09-14 ENCOUNTER — Other Ambulatory Visit (HOSPITAL_COMMUNITY): Payer: Self-pay

## 2024-09-14 ENCOUNTER — Telehealth: Payer: Self-pay

## 2024-09-14 NOTE — Telephone Encounter (Signed)
 Noted

## 2024-09-14 NOTE — Telephone Encounter (Signed)
 PA request has been Started. New Encounter has been or will be created for follow up. For additional info see Pharmacy Prior Auth telephone encounter from 09-14-2024.

## 2024-09-14 NOTE — Progress Notes (Signed)
 Attending Physician's Attestation   I have reviewed the chart.   I agree with the Advanced Practitioner's note, impression, and recommendations with any updates as below.    Corliss Parish, MD [facility] Office # [phone]

## 2024-09-14 NOTE — Telephone Encounter (Signed)
 Pharmacy Patient Advocate Encounter   Received notification from RX Request Messages that prior authorization for Esomeprazole  Magnesium  40MG  dr capsules is required/requested.   Insurance verification completed.   The patient is insured through [facility].   Prior Authorization form/request asks a question that requires your assistance. Please see the question below and advise accordingly. The PA will not be submitted until the necessary information is received.

## 2024-09-17 ENCOUNTER — Other Ambulatory Visit (HOSPITAL_COMMUNITY): Payer: Self-pay

## 2024-09-17 MED ORDER — OMEPRAZOLE 20 MG PO CPDR: 20.0000 mg | DELAYED_RELEASE_CAPSULE | Freq: Two times a day (BID) | ORAL | 3 refills | Status: AC

## 2024-09-17 NOTE — Telephone Encounter (Signed)
 PA request has been Started. New Encounter has been or will be created for follow up. For additional info see Pharmacy Prior Auth telephone encounter from 09-14-2024.

## 2024-09-17 NOTE — Telephone Encounter (Signed)
 Message sent to pt to see if Omperazole is ok to send for her

## 2024-09-17 NOTE — Telephone Encounter (Signed)
 Alternatives preferred. Documentation in encounter from 09-14-2024

## 2024-09-17 NOTE — Addendum Note (Signed)
 Addended by: ANITRA ODETTA CROME on: 09/17/2024 09:49 AM   Modules accepted: Orders
